# Patient Record
Sex: Female | Born: 1990
Health system: Southern US, Community
[De-identification: ages and names within clinical notes are randomized; demographics above are authoritative.]

## PROBLEM LIST (undated history)

## (undated) DIAGNOSIS — J45909 Unspecified asthma, uncomplicated: Secondary | ICD-10-CM

## (undated) DIAGNOSIS — N809 Endometriosis, unspecified: Secondary | ICD-10-CM

## (undated) HISTORY — PX: TONSILLECTOMY: SUR1361

---
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
## 2011-03-25 ENCOUNTER — Other Ambulatory Visit: Payer: Self-pay | Admitting: Obstetrics and Gynecology

## 2011-03-25 DIAGNOSIS — N63 Unspecified lump in unspecified breast: Secondary | ICD-10-CM

## 2011-03-30 ENCOUNTER — Other Ambulatory Visit: Payer: Self-pay

## 2011-04-02 ENCOUNTER — Ambulatory Visit
Admission: RE | Admit: 2011-04-02 | Discharge: 2011-04-02 | Disposition: A | Discharge status: Home / Self Care | Payer: Self-pay | Source: Ambulatory Visit | Attending: Obstetrics and Gynecology | Admitting: Obstetrics and Gynecology

## 2011-04-02 DIAGNOSIS — N63 Unspecified lump in unspecified breast: Secondary | ICD-10-CM

## 2011-04-14 HISTORY — PX: ABLATION ON ENDOMETRIOSIS: SHX5787

## 2011-10-12 ENCOUNTER — Other Ambulatory Visit: Payer: Self-pay | Admitting: Obstetrics and Gynecology

## 2011-10-12 DIAGNOSIS — N63 Unspecified lump in unspecified breast: Secondary | ICD-10-CM

## 2011-10-27 ENCOUNTER — Ambulatory Visit
Admission: RE | Admit: 2011-10-27 | Discharge: 2011-10-27 | Disposition: A | Discharge status: Home / Self Care | Payer: Federal, State, Local not specified - PPO | Source: Ambulatory Visit | Attending: Obstetrics and Gynecology | Admitting: Obstetrics and Gynecology

## 2011-10-27 DIAGNOSIS — N63 Unspecified lump in unspecified breast: Secondary | ICD-10-CM

## 2012-05-30 ENCOUNTER — Other Ambulatory Visit: Payer: Self-pay | Admitting: Obstetrics and Gynecology

## 2012-05-30 DIAGNOSIS — N63 Unspecified lump in unspecified breast: Secondary | ICD-10-CM

## 2012-06-17 ENCOUNTER — Other Ambulatory Visit: Payer: Federal, State, Local not specified - PPO

## 2012-07-04 ENCOUNTER — Ambulatory Visit
Admission: RE | Admit: 2012-07-04 | Discharge: 2012-07-04 | Disposition: A | Discharge status: Home / Self Care | Payer: Federal, State, Local not specified - PPO | Source: Ambulatory Visit | Attending: Obstetrics and Gynecology | Admitting: Obstetrics and Gynecology

## 2012-07-04 DIAGNOSIS — N63 Unspecified lump in unspecified breast: Secondary | ICD-10-CM

## 2013-04-19 ENCOUNTER — Other Ambulatory Visit: Payer: Self-pay | Admitting: Obstetrics and Gynecology

## 2013-04-19 DIAGNOSIS — N63 Unspecified lump in unspecified breast: Secondary | ICD-10-CM

## 2013-04-25 ENCOUNTER — Ambulatory Visit
Admission: RE | Admit: 2013-04-25 | Discharge: 2013-04-25 | Disposition: A | Discharge status: Home / Self Care | Payer: Federal, State, Local not specified - PPO | Source: Ambulatory Visit | Attending: Obstetrics and Gynecology | Admitting: Obstetrics and Gynecology

## 2013-04-25 ENCOUNTER — Other Ambulatory Visit: Payer: Federal, State, Local not specified - PPO

## 2013-04-25 DIAGNOSIS — N63 Unspecified lump in unspecified breast: Secondary | ICD-10-CM

## 2015-08-10 ENCOUNTER — Emergency Department (HOSPITAL_COMMUNITY): Payer: Federal, State, Local not specified - PPO

## 2015-08-10 ENCOUNTER — Encounter (HOSPITAL_COMMUNITY): Payer: Self-pay | Admitting: Emergency Medicine

## 2015-08-10 ENCOUNTER — Emergency Department (HOSPITAL_COMMUNITY)
Admission: EM | Admit: 2015-08-10 | Discharge: 2015-08-10 | Disposition: A | Discharge status: Home / Self Care | Payer: Federal, State, Local not specified - PPO | Attending: Emergency Medicine | Admitting: Emergency Medicine

## 2015-08-10 DIAGNOSIS — M25512 Pain in left shoulder: Secondary | ICD-10-CM | POA: Diagnosis not present

## 2015-08-10 DIAGNOSIS — R42 Dizziness and giddiness: Secondary | ICD-10-CM | POA: Insufficient documentation

## 2015-08-10 DIAGNOSIS — R200 Anesthesia of skin: Secondary | ICD-10-CM | POA: Diagnosis not present

## 2015-08-10 DIAGNOSIS — J45909 Unspecified asthma, uncomplicated: Secondary | ICD-10-CM | POA: Diagnosis not present

## 2015-08-10 DIAGNOSIS — D72829 Elevated white blood cell count, unspecified: Secondary | ICD-10-CM | POA: Diagnosis not present

## 2015-08-10 DIAGNOSIS — Z79899 Other long term (current) drug therapy: Secondary | ICD-10-CM | POA: Diagnosis not present

## 2015-08-10 DIAGNOSIS — M25511 Pain in right shoulder: Secondary | ICD-10-CM | POA: Diagnosis not present

## 2015-08-10 DIAGNOSIS — M791 Myalgia, unspecified site: Secondary | ICD-10-CM

## 2015-08-10 DIAGNOSIS — Z3202 Encounter for pregnancy test, result negative: Secondary | ICD-10-CM | POA: Insufficient documentation

## 2015-08-10 DIAGNOSIS — M542 Cervicalgia: Secondary | ICD-10-CM | POA: Diagnosis not present

## 2015-08-10 HISTORY — DX: Unspecified asthma, uncomplicated: J45.909

## 2015-08-10 LAB — CBC WITH DIFFERENTIAL/PLATELET
Basophils Absolute: 0 10*3/uL (ref 0.0–0.1)
Basophils Relative: 0 %
EOS PCT: 1 %
Eosinophils Absolute: 0.2 10*3/uL (ref 0.0–0.7)
HEMATOCRIT: 43.7 % (ref 36.0–46.0)
Hemoglobin: 14.3 g/dL (ref 12.0–15.0)
LYMPHS ABS: 2.5 10*3/uL (ref 0.7–4.0)
LYMPHS PCT: 16 %
MCH: 30.6 pg (ref 26.0–34.0)
MCHC: 32.7 g/dL (ref 30.0–36.0)
MCV: 93.4 fL (ref 78.0–100.0)
MONO ABS: 1.3 10*3/uL — AB (ref 0.1–1.0)
MONOS PCT: 8 %
NEUTROS ABS: 11.5 10*3/uL — AB (ref 1.7–7.7)
Neutrophils Relative %: 75 %
Platelets: 295 10*3/uL (ref 150–400)
RBC: 4.68 MIL/uL (ref 3.87–5.11)
RDW: 12.7 % (ref 11.5–15.5)
WBC: 15.5 10*3/uL — ABNORMAL HIGH (ref 4.0–10.5)

## 2015-08-10 LAB — I-STAT BETA HCG BLOOD, ED (MC, WL, AP ONLY): I-stat hCG, quantitative: <5 m[IU]/mL (ref <5)

## 2015-08-10 LAB — BASIC METABOLIC PANEL
ANION GAP: 11 (ref 5–15)
BUN: 8 mg/dL (ref 6–20)
CALCIUM: 9.2 mg/dL (ref 8.9–10.3)
CO2: 23 mmol/L (ref 22–32)
CREATININE: 0.81 mg/dL (ref 0.44–1.00)
Chloride: 102 mmol/L (ref 101–111)
GFR calc Af Amer: >60 mL/min (ref >60)
GFR calc non Af Amer: >60 mL/min (ref >60)
GLUCOSE: 101 mg/dL — AB (ref 65–99)
Potassium: 3.9 mmol/L (ref 3.5–5.1)
Sodium: 136 mmol/L (ref 135–145)

## 2015-08-10 LAB — CK: CK TOTAL: 52 U/L (ref 38–234)

## 2015-08-10 LAB — D-DIMER, QUANTITATIVE: D-Dimer, Quant: <0.27 ug/mL-FEU (ref 0.00–0.50)

## 2015-08-10 LAB — TROPONIN I: Troponin I: <0.03 ng/mL (ref <0.031)

## 2015-08-10 MED ORDER — ACETAMINOPHEN 325 MG PO TABS
650.0000 mg | ORAL_TABLET | Freq: Once | ORAL | Status: AC
  Administered 2015-08-10: 650 mg via ORAL
  Filled 2015-08-10: qty 2

## 2015-08-10 MED ORDER — KETOROLAC TROMETHAMINE 60 MG/2ML IM SOLN
60.0000 mg | Freq: Once | INTRAMUSCULAR | Status: AC
  Administered 2015-08-10: 60 mg via INTRAMUSCULAR
  Filled 2015-08-10: qty 2

## 2015-08-10 MED ORDER — CYCLOBENZAPRINE HCL 10 MG PO TABS: 10.0000 mg | ORAL_TABLET | Freq: Two times a day (BID) | ORAL | Status: AC | PRN

## 2015-08-10 MED ORDER — NAPROXEN 500 MG PO TABS
500.0000 mg | ORAL_TABLET | Freq: Two times a day (BID) | ORAL | Status: AC
Start: 2015-08-10 — End: ?

## 2015-08-10 NOTE — ED Notes (Signed)
Pt transported to xray 

## 2015-08-10 NOTE — ED Provider Notes (Signed)
CSN: [id number]     Arrival date & time 08/10/15  [id number] History   First MD Initiated Contact with Patient 08/10/15 [phone]     Chief Complaint  Patient presents with  . Arm Pain     (Consider location/radiation/quality/duration/timing/severity/associated sxs/prior Treatment) HPI Nicole Cowan is a 25 y.o. female with PMH significant for asthma who presents with gradual onset, constant, worsening neck, bilateral shoulders, and right arm soreness that began approximately 11 PM last night.  She reports she has been sick for the past month with URI and a sinus infection, so she reports persistent cough with soreness.  No modifying factors.  She reports trying muscle relaxers and OTC pain medications without relief.  She does state her bilateral hands felt numb on the way to the ED this morning.  She denies HA, neck stiffness, weakness.  Denies trauma. No hx of DVT/PE, unilateral leg swelling, recent surgery/trauma.  She does take PO OCP.  Past Medical History  Diagnosis Date  . Asthma    Past Surgical History  Procedure Laterality Date  . Tonsillectomy     No family history on file. Social History  Substance Use Topics  . Smoking status: Never Smoker   . Smokeless tobacco: None  . Alcohol Use: Yes     Comment: "occassionally"   OB History    No data available     Review of Systems  Constitutional: Negative for fever and chills.  Respiratory: Positive for cough. Negative for shortness of breath.   Cardiovascular: Negative for chest pain and leg swelling.  Gastrointestinal: Negative for nausea, vomiting, abdominal pain and diarrhea.  Genitourinary: Negative for dysuria, urgency and hematuria.  Neurological: Positive for dizziness and numbness. Negative for weakness and headaches.  All other systems reviewed and are negative.     Allergies  Review of patient's allergies indicates no known allergies.  Home Medications   Prior to Admission medications   Medication Sig Start  Date End Date Taking? Authorizing Provider  albuterol (PROVENTIL HFA;VENTOLIN HFA) 108 (90 Base) MCG/ACT inhaler Inhale 1-2 puffs into the lungs every 6 (six) hours as needed for wheezing or shortness of breath.   Yes Historical Provider, MD  cetirizine (ZYRTEC) 10 MG tablet Take 10 mg by mouth daily.   Yes Historical Provider, MD  Norethindrone Acetate-Ethinyl Estrad-FE (MICROGESTIN 24 FE) 1-20 MG-MCG(24) tablet Take 1 tablet by mouth daily.   Yes Historical Provider, MD   BP 138/84 mmHg  Pulse 92  Temp(Src) 98.1 F (36.7 C) (Rectal)  Resp 16  SpO2 98%  LMP 07/18/2015 Physical Exam  Constitutional: She is oriented to person, place, and time. She appears well-developed and well-nourished.  Non-toxic appearance. She does not have a sickly appearance. She does not appear ill.  HENT:  Head: Normocephalic and atraumatic.  Mouth/Throat: Oropharynx is clear and moist.  Eyes: Conjunctivae are normal. Pupils are equal, round, and reactive to light.  Neck: Normal range of motion. Neck supple.  No nuchal rigidity.  FAROM of neck in forward flexion and lateral flexion without pain. No midline tenderness.  No paracervical musculature tenderness.   Cardiovascular: Normal rate, regular rhythm and normal heart sounds.   No murmur heard. Pulses:      Radial pulses are 2+ on the right side, and 2+ on the left side.  Pulmonary/Chest: Effort normal and breath sounds normal. No accessory muscle usage or stridor. No respiratory distress. She has no wheezes. She has no rhonchi. She has no rales.  Abdominal: Soft. Bowel sounds  are normal. She exhibits no distension. There is no tenderness. There is no rebound and no guarding.  Musculoskeletal: Normal range of motion. She exhibits no tenderness.  No thoracic, lumbar, or sacral midline tenderness.   Lymphadenopathy:    She has no cervical adenopathy.  Neurological: She is alert and oriented to person, place, and time. She has normal strength. No cranial nerve  deficit or sensory deficit. Coordination normal.  Speech clear without dysarthria.  Skin: Skin is warm and dry.  Psychiatric: She has a normal mood and affect. Her behavior is normal.    ED Course  Procedures (including critical care time) Labs Review Labs Reviewed  CBC WITH DIFFERENTIAL/PLATELET - Abnormal; Notable for the following:    WBC 15.5 (*)    Neutro Abs 11.5 (*)    Monocytes Absolute 1.3 (*)    All other components within normal limits  BASIC METABOLIC PANEL - Abnormal; Notable for the following:    Glucose, Bld 101 (*)    All other components within normal limits  TROPONIN I  D-DIMER, QUANTITATIVE (NOT AT [facility])  CK  I-STAT BETA HCG BLOOD, ED ([facility], [facility], [facility] ONLY)    Imaging Review Dg Neck Soft Tissue  08/10/2015  CLINICAL DATA:  Sore throat cough and fever recently now short of breath EXAM: NECK SOFT TISSUES - 1+ VIEW COMPARISON:  None. FINDINGS: There is no evidence of retropharyngeal soft tissue swelling or epiglottic enlargement. The cervical airway is unremarkable and no radio-opaque foreign body identified. IMPRESSION: Negative. Electronically Signed   By: Skipper Cliche M.D.   On: 08/10/2015 08:26   Dg Chest 2 View  08/10/2015  CLINICAL DATA:  Sore throat cough and fever for 1 month EXAM: CHEST  2 VIEW COMPARISON:  None. FINDINGS: Normal heart size. Lungs clear. No pneumothorax. No pleural effusion. Mild bronchitic changes and hyperaeration. IMPRESSION: Mild bronchitic changes and hyperaeration. Electronically Signed   By: Marybelle Killings M.D.   On: 08/10/2015 08:24   I have personally reviewed and evaluated these images and lab results as part of my medical decision-making.   EKG Interpretation   Date/Time:  Saturday August 10 2015 08:41:33 EDT Ventricular Rate:  85 PR Interval:  142 QRS Duration: 86 QT Interval:  358 QTC Calculation: 426 R Axis:   73 Text Interpretation:  Sinus rhythm No previous ECGs available Confirmed by  RANCOUR  MD, STEPHEN [phone]) on  08/10/2015 8:45:38 AM      MDM   Final diagnoses:  Neck pain  Bilateral shoulder pain  Muscle soreness  Leukocytosis   Patient presents with neck pain, bilateral shoulder pain, and right arm pain. She describes it as a soreness. No injury or trauma. Patient does state she has been sick for the past couple of weeks. She was taking a cough suppressant as well as by mouth steroids for a recent URI where she was having cough with sharp chest pain and shortness of breath. On exam, patient appears well, nontoxic or septic appearing. No nuchal rigidity. Full active range of motion of cervical spine in forward and lateral flexion without pain. No cervical midline tenderness. No paracervical muscular tenderness. Heart RRR, lungs CTAP, abdomen soft and benign. Normal neuro exam.  Doubt meningitis. Doubt ACS, HEART score 0.  EKG SR without acute changes, troponin 0.00. CXR negative. Low risk PE using Well's criteria, unable to [facility] due to exogenous estrogen use, d-dimer negative.  Doubt rhabdomyolysis, CK 52.  No metabolic dearrangements. Leukocytosis, WBC 15.5, likely due to steroid use.  Doubt mononucleosis, no sore throat.  No trauma/injury, negative cervical spine plain films.  Patient received Toradol and Tylenol in ED with symptom resolution.  Repeat exam, continued FAROM of cervical spine without pain.  No rigidity.  This is likely musculoskeletal.  Plan to discharge home with Naproxen and Flexeril.  Follow up PCP in 2-3 days.  Discussed return precautions.  Patient agrees and acknowledges the above plan for discharge.   Case has been discussed with and seen by Dr. Wyvonnia Dusky who agrees with the above plan for discharge.     Gloriann Loan, PA-C 08/10/15 [address] PA-C 08/10/15 [place], MD 08/10/15 [phone]

## 2015-08-10 NOTE — ED Notes (Signed)
Pt from home with c/o neck pain, shoulder pain and right arm pain. Pt denies trauma.

## 2015-08-10 NOTE — Discharge Instructions (Signed)
Your xrays today show no acute changes.  Your lab work today is reassuring.  This is likely musculoskeletal in nature.  Please take Naproxen twice daily and Flexeril twice daily or at bedtime.  Do not operate heavy machinery or drive while taking Flexeril.  Follow up with your primary physician in the next 2-3 days.  Return if you experiencing worsening pain, neck stiffness, numbness, or weakness.  Muscle Pain, Adult Muscle pain (myalgia) may be caused by many things, including:  Overuse or muscle strain, especially if you are not in shape. This is the most common cause of muscle pain.  Injury.  Bruises.  Viruses, such as the flu.  Infectious diseases.  Fibromyalgia, which is a chronic condition that causes muscle tenderness, fatigue, and headache.  Autoimmune diseases, including lupus.  Certain drugs, including ACE inhibitors and statins. Muscle pain may be mild or severe. In most cases, the pain lasts only a short time and goes away without treatment. To diagnose the cause of your muscle pain, your health care provider will take your medical history. This means he or she will ask you when your muscle pain began and what has been happening. If you have not had muscle pain for very long, your health care provider may want to wait before doing much testing. If your muscle pain has lasted a long time, your health care provider may want to run tests right away. If your health care provider thinks your muscle pain may be caused by illness, you may need to have additional tests to rule out certain conditions.  Treatment for muscle pain depends on the cause. Home care is often enough to relieve muscle pain. Your health care provider may also prescribe anti-inflammatory medicine. HOME CARE INSTRUCTIONS Watch your condition for any changes. The following actions may help to lessen any discomfort you are feeling:  Only take over-the-counter or prescription medicines as directed by your health care  provider.  Apply ice to the sore muscle:  Put ice in a plastic bag.  Place a towel between your skin and the bag.  Leave the ice on for 15-20 minutes, 3-4 times a day.  You may alternate applying hot and cold packs to the muscle as directed by your health care provider.  If overuse is causing your muscle pain, slow down your activities until the pain goes away.  Remember that it is normal to feel some muscle pain after starting a workout program. Muscles that have not been used often will be sore at first.  Do regular, gentle exercises if you are not usually active.  Warm up before exercising to lower your risk of muscle pain.  Do not continue working out if the pain is very bad. Bad pain could mean you have injured a muscle. SEEK MEDICAL CARE IF:  Your muscle pain gets worse, and medicines do not help.  You have muscle pain that lasts longer than 3 days.  You have a rash or fever along with muscle pain.  You have muscle pain after a tick bite.  You have muscle pain while working out, even though you are in good physical condition.  You have redness, soreness, or swelling along with muscle pain.  You have muscle pain after starting a new medicine or changing the dose of a medicine. SEEK IMMEDIATE MEDICAL CARE IF:  You have trouble breathing.  You have trouble swallowing.  You have muscle pain along with a stiff neck, fever, and vomiting.  You have severe muscle weakness  or cannot move part of your body. MAKE SURE YOU:   Understand these instructions.  Will watch your condition.  Will get help right away if you are not doing well or get worse.   This information is not intended to replace advice given to you by your health care provider. Make sure you discuss any questions you have with your health care provider.   Document Released: 02/19/2006 Document Revised: 04/20/2014 Document Reviewed: 01/24/2013 Elsevier Interactive Patient Education [employer].

## 2016-10-13 ENCOUNTER — Other Ambulatory Visit: Payer: Self-pay | Admitting: Physician Assistant

## 2016-10-13 ENCOUNTER — Ambulatory Visit
Admission: RE | Admit: 2016-10-13 | Discharge: 2016-10-13 | Disposition: A | Discharge status: Home / Self Care | Payer: Federal, State, Local not specified - PPO | Source: Ambulatory Visit | Attending: Physician Assistant | Admitting: Physician Assistant

## 2016-10-13 DIAGNOSIS — J189 Pneumonia, unspecified organism: Secondary | ICD-10-CM

## 2018-12-30 ENCOUNTER — Other Ambulatory Visit: Payer: Self-pay | Admitting: Obstetrics and Gynecology

## 2018-12-30 DIAGNOSIS — D242 Benign neoplasm of left breast: Secondary | ICD-10-CM

## 2018-12-30 DIAGNOSIS — D241 Benign neoplasm of right breast: Secondary | ICD-10-CM

## 2019-01-04 ENCOUNTER — Other Ambulatory Visit (HOSPITAL_COMMUNITY): Payer: Self-pay | Admitting: *Deleted

## 2019-01-04 DIAGNOSIS — N63 Unspecified lump in unspecified breast: Secondary | ICD-10-CM

## 2019-01-24 ENCOUNTER — Other Ambulatory Visit: Payer: Self-pay

## 2019-01-24 ENCOUNTER — Ambulatory Visit
Admission: RE | Admit: 2019-01-24 | Discharge: 2019-01-24 | Disposition: A | Discharge status: Home / Self Care | Payer: Federal, State, Local not specified - PPO | Source: Ambulatory Visit | Attending: Obstetrics and Gynecology | Admitting: Obstetrics and Gynecology

## 2019-01-24 ENCOUNTER — Ambulatory Visit (HOSPITAL_COMMUNITY)
Admission: RE | Admit: 2019-01-24 | Discharge: 2019-01-24 | Disposition: A | Discharge status: Home / Self Care | Payer: Self-pay | Source: Ambulatory Visit | Attending: Obstetrics and Gynecology | Admitting: Obstetrics and Gynecology

## 2019-01-24 ENCOUNTER — Encounter (HOSPITAL_COMMUNITY): Payer: Self-pay

## 2019-01-24 ENCOUNTER — Ambulatory Visit
Admission: RE | Admit: 2019-01-24 | Discharge: 2019-01-24 | Disposition: A | Discharge status: Home / Self Care | Payer: No Typology Code available for payment source | Source: Ambulatory Visit | Attending: Obstetrics and Gynecology | Admitting: Obstetrics and Gynecology

## 2019-01-24 DIAGNOSIS — Z1239 Encounter for other screening for malignant neoplasm of breast: Secondary | ICD-10-CM | POA: Insufficient documentation

## 2019-01-24 DIAGNOSIS — N6310 Unspecified lump in the right breast, unspecified quadrant: Secondary | ICD-10-CM

## 2019-01-24 DIAGNOSIS — N6321 Unspecified lump in the left breast, upper outer quadrant: Secondary | ICD-10-CM | POA: Insufficient documentation

## 2019-01-24 DIAGNOSIS — N63 Unspecified lump in unspecified breast: Secondary | ICD-10-CM

## 2019-01-24 HISTORY — DX: Endometriosis, unspecified: N80.9

## 2019-01-24 NOTE — Patient Instructions (Signed)
Explained breast self awareness with Randell Loop. Patient did not need a Pap smear today due to last Pap smear was in 04/25/2018 per patient. Let her know [facility] will cover Pap smears every 3 years unless has a history of abnormal Pap smears. Referred patient to the [facility] for a bilateral breast ultrasound Appointment scheduled for Tuesday, January 24, 2019 at 1530. Patient aware of appointment and will be there. Randell Loop verbalized understanding.  [place], Arvil Chaco, RN 1:35 PM

## 2019-01-24 NOTE — Progress Notes (Signed)
Complaints of bilateral breast lumps since she was 28 years old that patient stated she has noticed an increase in size in the left breast lumps. Patient states the lumps are tender to the touch and the pain started 6 months ago. Patient rates the pain at a 4-5 out of 10.  Pap Smear: Pap smear not completed today. Last Pap smear was 01/24/2019 at Dr. Gregor Hams office and patient is awaiting results. Per patient has a history of an abnormal Pap smear in 2017 that a repeat Pap smear was completed for follow-up that was normal. No Pap smear results are in Epic.  Physical exam: Breasts Breasts symmetrical. No skin abnormalities left breast. Scar observed right upper inner breast that per patient was from a history of a lumpectomy for a benign breast lump. No nipple retraction bilateral breasts. No nipple discharge bilateral breasts. No lymphadenopathy. Palpated a lump within the right breast under the scar at 2 o'clock 5 cm from the nipple. Palpated two lumps within the left breast at 2 o'clock under the areola that was mobile and at 2 o'clock 5 cm from the nipple. Complaints of tenderness when palpated bilateral breast lumps. Referred patient to the [facility] for a bilateral breast ultrasound Appointment scheduled for Tuesday, January 24, 2019 at 1530.        Pelvic/Bimanual No Pap smear completed today since last Pap smear was 01/24/2019. Pap smear not indicated per [facility] guidelines.   Smoking History: Patient has never smoked.  Patient Navigation: Patient education provided. Access to services provided for patient through [facility] program.   Breast and Cervical Cancer Risk Assessment: Patient has a family history of a maternal great aunt having breast cancer. Patient has no known genetic mutations or history of radiation treatment to the chest before age 33. Patient has no history of cervical dysplasia, immunocompromised, or DES exposure in-utero. Breast cancer risk assessment completed.  No breast cancer risk calculated due to patient is less than 75 years old.

## 2019-02-06 ENCOUNTER — Other Ambulatory Visit (HOSPITAL_COMMUNITY): Payer: Self-pay | Admitting: Obstetrics and Gynecology

## 2019-02-06 ENCOUNTER — Other Ambulatory Visit: Payer: Self-pay

## 2019-02-06 ENCOUNTER — Ambulatory Visit
Admission: RE | Admit: 2019-02-06 | Discharge: 2019-02-06 | Disposition: A | Discharge status: Home / Self Care | Payer: No Typology Code available for payment source | Source: Ambulatory Visit | Attending: Obstetrics and Gynecology | Admitting: Obstetrics and Gynecology

## 2019-02-06 DIAGNOSIS — N63 Unspecified lump in unspecified breast: Secondary | ICD-10-CM

## 2019-02-06 DIAGNOSIS — N6320 Unspecified lump in the left breast, unspecified quadrant: Secondary | ICD-10-CM

## 2019-04-05 DIAGNOSIS — F411 Generalized anxiety disorder: Secondary | ICD-10-CM | POA: Diagnosis not present

## 2019-04-14 HISTORY — PX: BREAST EXCISIONAL BIOPSY: SUR124

## 2019-05-01 DIAGNOSIS — F411 Generalized anxiety disorder: Secondary | ICD-10-CM | POA: Diagnosis not present

## 2019-05-12 DIAGNOSIS — F411 Generalized anxiety disorder: Secondary | ICD-10-CM | POA: Diagnosis not present

## 2019-05-25 DIAGNOSIS — F411 Generalized anxiety disorder: Secondary | ICD-10-CM | POA: Diagnosis not present

## 2019-06-06 DIAGNOSIS — F411 Generalized anxiety disorder: Secondary | ICD-10-CM | POA: Diagnosis not present

## 2019-08-08 ENCOUNTER — Ambulatory Visit
Admission: RE | Admit: 2019-08-08 | Discharge: 2019-08-08 | Disposition: A | Discharge status: Home / Self Care | Payer: No Typology Code available for payment source | Source: Ambulatory Visit | Attending: Obstetrics and Gynecology | Admitting: Obstetrics and Gynecology

## 2019-08-08 ENCOUNTER — Other Ambulatory Visit: Payer: Self-pay

## 2019-08-08 ENCOUNTER — Other Ambulatory Visit: Payer: Self-pay | Admitting: Obstetrics and Gynecology

## 2019-08-08 DIAGNOSIS — D242 Benign neoplasm of left breast: Secondary | ICD-10-CM

## 2019-08-08 DIAGNOSIS — N6320 Unspecified lump in the left breast, unspecified quadrant: Secondary | ICD-10-CM

## 2019-09-18 DIAGNOSIS — Z03818 Encounter for observation for suspected exposure to other biological agents ruled out: Secondary | ICD-10-CM | POA: Diagnosis not present

## 2019-09-18 DIAGNOSIS — J45909 Unspecified asthma, uncomplicated: Secondary | ICD-10-CM | POA: Diagnosis not present

## 2019-09-18 DIAGNOSIS — J22 Unspecified acute lower respiratory infection: Secondary | ICD-10-CM | POA: Diagnosis not present

## 2020-01-25 DIAGNOSIS — D179 Benign lipomatous neoplasm, unspecified: Secondary | ICD-10-CM | POA: Diagnosis not present

## 2020-01-25 DIAGNOSIS — Z131 Encounter for screening for diabetes mellitus: Secondary | ICD-10-CM | POA: Diagnosis not present

## 2020-01-25 DIAGNOSIS — Z13220 Encounter for screening for lipoid disorders: Secondary | ICD-10-CM | POA: Diagnosis not present

## 2020-02-08 ENCOUNTER — Ambulatory Visit
Admission: RE | Admit: 2020-02-08 | Discharge: 2020-02-08 | Disposition: A | Discharge status: Home / Self Care | Payer: BC Managed Care – PPO | Source: Ambulatory Visit | Attending: Obstetrics and Gynecology | Admitting: Obstetrics and Gynecology

## 2020-02-08 ENCOUNTER — Other Ambulatory Visit: Payer: Self-pay

## 2020-02-08 DIAGNOSIS — D242 Benign neoplasm of left breast: Secondary | ICD-10-CM

## 2020-02-08 DIAGNOSIS — N6322 Unspecified lump in the left breast, upper inner quadrant: Secondary | ICD-10-CM | POA: Diagnosis not present

## 2020-02-08 DIAGNOSIS — Z6828 Body mass index (BMI) 28.0-28.9, adult: Secondary | ICD-10-CM | POA: Diagnosis not present

## 2020-02-08 DIAGNOSIS — Z01419 Encounter for gynecological examination (general) (routine) without abnormal findings: Secondary | ICD-10-CM | POA: Diagnosis not present

## 2020-02-08 DIAGNOSIS — N6321 Unspecified lump in the left breast, upper outer quadrant: Secondary | ICD-10-CM | POA: Diagnosis not present

## 2020-02-08 DIAGNOSIS — N6323 Unspecified lump in the left breast, lower outer quadrant: Secondary | ICD-10-CM | POA: Diagnosis not present

## 2020-02-14 ENCOUNTER — Ambulatory Visit: Payer: Self-pay | Admitting: Surgery

## 2020-02-14 DIAGNOSIS — D171 Benign lipomatous neoplasm of skin and subcutaneous tissue of trunk: Secondary | ICD-10-CM | POA: Diagnosis not present

## 2020-02-14 NOTE — H&P (Signed)
History of Present Illness  The patient is a 29 year old female who presents with a complaint of Mass. [name] is a 29 year old female who presents to the surgery clinic due to a mass on her back. The mass has been there for some time and has been slowly growing. As it has grown it has become more noticeable and irritating under her clothes. Her primary care physician informed her this was likely a lipoma and referred her to our office for surgical excision. She has had previous resections of benign breast masses. She has asthma. She has dealt with endometriosis in the past.  Past Surgical History  Breast Biopsy  Right. Oral Surgery  Tonsillectomy   Diagnostic Studies History Colonoscopy  never Mammogram  never Pap Smear  1-5 years ago  Allergies  No Known Drug Allergies  [02/14/2020]: Allergies Reconciled   Medication History valACYclovir HCl (500MG  Tablet, Oral) Active. Ipratropium Bromide (0.06% Solution, Nasal) Active. Albuterol Sulfate HFA (108 (90 Base)MCG/ACT Aerosol Soln, Inhalation) Active. ZyrTEC Allergy (10MG  Capsule, Oral) Active. Multivitamin Adult (Oral) Active. Medications Reconciled  Social History Alcohol use  Occasional alcohol use. Caffeine use  Carbonated beverages, Coffee, Tea. No drug use  Tobacco use  Never smoker.  Family History Arthritis  Father, Mother. Depression  Mother. Migraine Headache  Mother.  Pregnancy / Birth History Age at menarche  65 years. Contraceptive History  Oral contraceptives. Gravida  0 Para  0 Regular periods   Other Problems Anxiety Disorder  Asthma  Back Pain  Bladder Problems  Gastroesophageal Reflux Disease  Lump In Breast  Migraine Headache     Review of Systems  General Present- Fatigue. Not Present- Appetite Loss, Chills, Fever, Night Sweats, Weight Gain and Weight Loss. Skin Not Present- Change in Wart/Mole, Dryness, Hives, Jaundice, New Lesions, Non-Healing Wounds,  Rash and Ulcer. HEENT Present- Seasonal Allergies and Wears glasses/contact lenses. Not Present- Earache, Hearing Loss, Hoarseness, Nose Bleed, Oral Ulcers, Ringing in the Ears, Sinus Pain, Sore Throat, Visual Disturbances and Yellow Eyes. Respiratory Not Present- Bloody sputum, Chronic Cough, Difficulty Breathing, Snoring and Wheezing. Breast Present- Breast Mass. Not Present- Breast Pain, Nipple Discharge and Skin Changes. Cardiovascular Not Present- Chest Pain, Difficulty Breathing Lying Down, Leg Cramps, Palpitations, Rapid Heart Rate, Shortness of Breath and Swelling of Extremities. Gastrointestinal Not Present- Abdominal Pain, Bloating, Bloody Stool, Change in Bowel Habits, Chronic diarrhea, Constipation, Difficulty Swallowing, Excessive gas, Gets full quickly at meals, Hemorrhoids, Indigestion, Nausea, Rectal Pain and Vomiting. Female Genitourinary Present- Urgency. Not Present- Frequency, Nocturia, Painful Urination and Pelvic Pain. Musculoskeletal Present- Back Pain and Muscle Pain. Not Present- Joint Pain, Joint Stiffness, Muscle Weakness and Swelling of Extremities. Neurological Present- Headaches and Tingling. Not Present- Decreased Memory, Fainting, Numbness, Seizures, Tremor, Trouble walking and Weakness. Psychiatric Present- Anxiety. Not Present- Bipolar, Change in Sleep Pattern, Depression, Fearful and Frequent crying. Endocrine Not Present- Cold Intolerance, Excessive Hunger, Hair Changes, Heat Intolerance, Hot flashes and New Diabetes. Hematology Present- Easy Bruising. Not Present- Blood Thinners, Excessive bleeding, Gland problems, HIV and Persistent Infections.  Vitals  Weight: 184 lb Height: 67in Body Surface Area: 1.95 m Body Mass Index: 28.82 kg/m  Temp.: 98.60F  Pulse: 113 (Regular)  P.OX: 98% (Room air) BP: 128/82(Sitting, Left Arm, Standard)  Physical Exam General Mental Status-Alert. General Appearance-Consistent with stated age. Posture-Normal  posture. Voice-Normal.  Integumentary Note: no rash or lesion on limited exam.  there is a 6 cm cm round, rubbery, mobile, subcutaneous mass on the right upper back  Head and Neck Head -Note: atraumatic, normocephalic.  Face Strength and Tone - facial muscle strength and tone is normal.  Eye Eyeball - Bilateral-Extraocular movements intact. Sclera/Conjunctiva - Bilateral-Normal.  Chest and Lung Exam Chest and lung exam reveals -quiet, even and easy respiratory effort with no use of accessory muscles.  Breast Breast - Left-Symmetric, Non Tender, No Biopsy scars, no Dimpling - Left, No Inflammation, No Lumpectomy scars, No Mastectomy scars, No Peau d' Orange. Breast - Right-Symmetric, Non Tender, No Biopsy scars, no Dimpling - Right, No Inflammation, No Lumpectomy scars, No Mastectomy scars, No Peau d' Orange. Breast Lump-No Palpable Breast Mass.  Cardiovascular Cardiovascular examination reveals -normal heart sounds, regular rate and rhythm with no murmurs and normal pedal pulses bilaterally.  Abdomen Inspection Inspection of the abdomen reveals - No Hernias. Skin - Scar - no surgical scars. Palpation/Percussion Palpation and Percussion of the abdomen reveal - Soft, Non Tender, No Rebound tenderness, No Rigidity (guarding) and No hepatosplenomegaly. Auscultation Auscultation of the abdomen reveals - Bowel sounds normal.  Neurologic Mental Status-Normal.  Neuropsychiatric Mental status exam performed with findings of-able to articulate well with normal speech/language, rate, volume and coherence. The patient's mood and affect are described as -normal. Judgment and Insight-insight is appropriate concerning matters relevant to self and the patient displays appropriate judgment regarding every day activities. Thought Processes/Cognitive Function-aware of current events.  Musculoskeletal Note: strength symmetrical throughout, no  deformity   Lymphatic Head & Neck  General Head & Neck Lymphatics: Bilateral - Description - Normal. Axillary  General Axillary Region: Bilateral - Description - Normal. Tenderness - Non Tender. Femoral & Inguinal  Generalized Femoral & Inguinal Lymphatics: Bilateral - Description - Normal. Tenderness - Non Tender.   Assessment & Plan LIPOMA OF BACK (D17.1) Impression: Discussed the benign nature of a lipoma and offered surgical resection as it is irritating and she would like it removed. The risks, benefits, and alternatives to surgery were discussed with the patient who granted consent to proceed. We will schedule for elective surgery as soon as time is available. Surgery scheduler will give you a call to schedule surgery.  Signed by Felicie Morn, MD

## 2020-03-11 DIAGNOSIS — R87610 Atypical squamous cells of undetermined significance on cytologic smear of cervix (ASC-US): Secondary | ICD-10-CM | POA: Diagnosis not present

## 2020-03-12 DIAGNOSIS — R87610 Atypical squamous cells of undetermined significance on cytologic smear of cervix (ASC-US): Secondary | ICD-10-CM | POA: Diagnosis not present

## 2020-04-24 DIAGNOSIS — B349 Viral infection, unspecified: Secondary | ICD-10-CM | POA: Diagnosis not present

## 2020-05-06 DIAGNOSIS — Z13220 Encounter for screening for lipoid disorders: Secondary | ICD-10-CM | POA: Diagnosis not present

## 2020-05-06 DIAGNOSIS — Z0000 Encounter for general adult medical examination without abnormal findings: Secondary | ICD-10-CM | POA: Diagnosis not present

## 2020-05-06 DIAGNOSIS — Z131 Encounter for screening for diabetes mellitus: Secondary | ICD-10-CM | POA: Diagnosis not present

## 2021-02-01 IMAGING — US US BREAST*R* LIMITED INC AXILLA
1 series · 8 of 8 positions shown · non-contrast
Comparison: Previous exam(s).

CLINICAL DATA: The patient has had multiple fibroadenomas removed
in the past. She had 10 masses followed in 5518 which were stable
over time and thought to be fibroadenomas. These masses are no
longer being followed. She felt a tender lump in the left breast at
10 o'clock, 6 cm from the nipple and was seen at an outside
institution on December 13, 2018 for an ultrasound. Bilateral masses
were seen and follow-up was requested. The patient is unsure whether
the tender mass in the left breast has changed in size over time.

EXAM:
ULTRASOUND OF THE BILATERAL BREAST

[Series 1: us breast*right* limited inc axilla · 0.07mm/px · 8 of 8 slices shown]
[im 1/8]
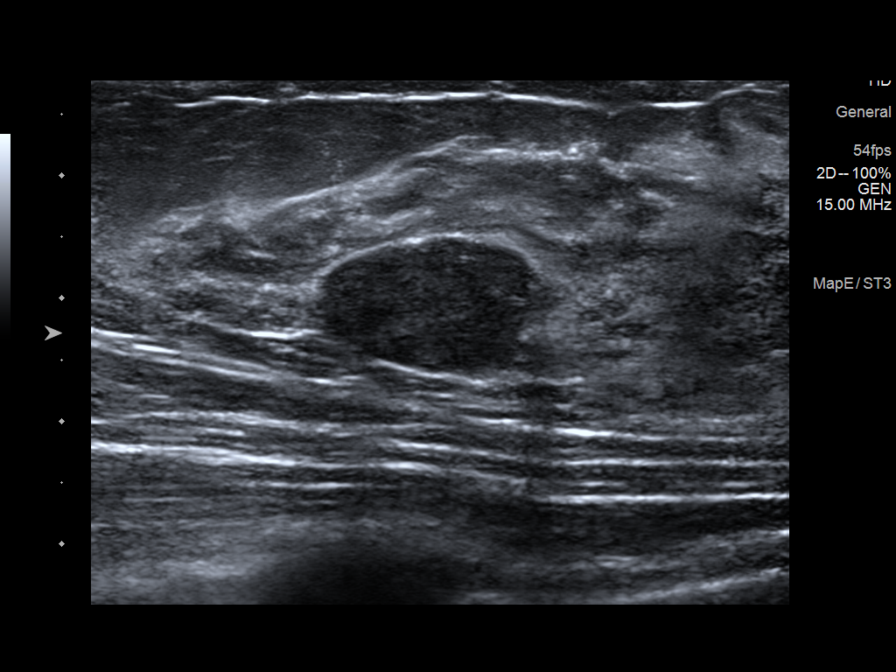
[im 2/8]
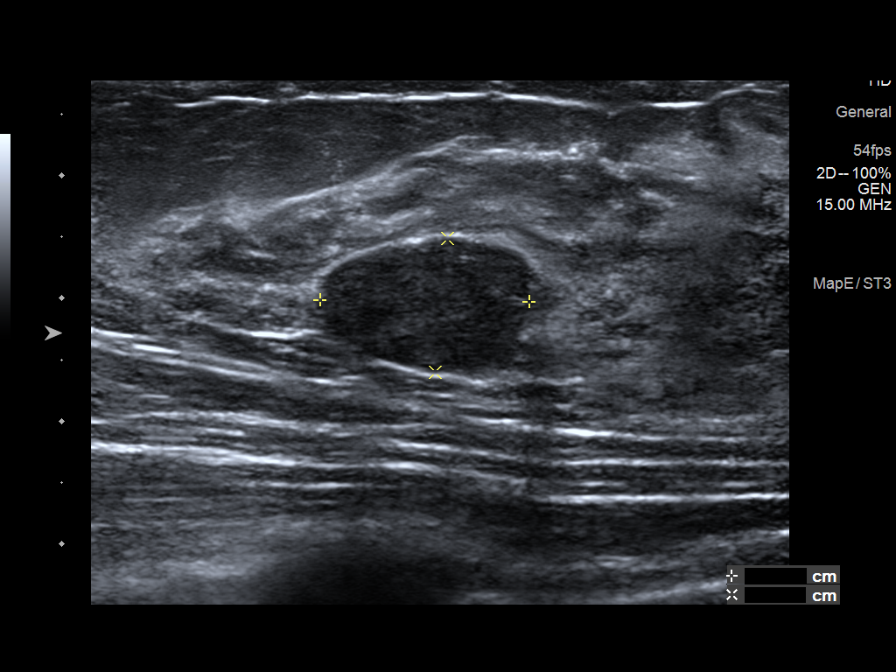
[im 3/8]
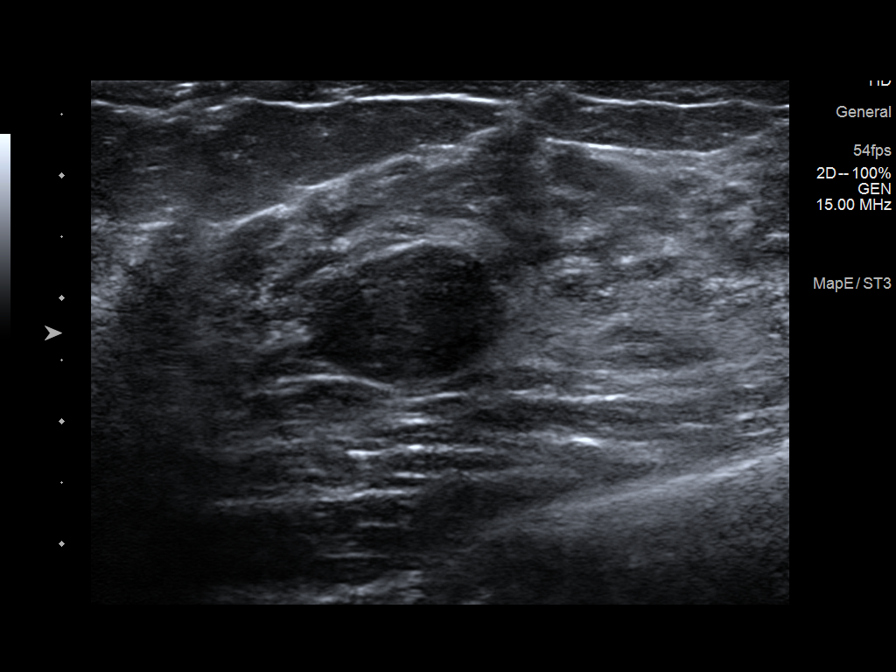
[im 4/8]
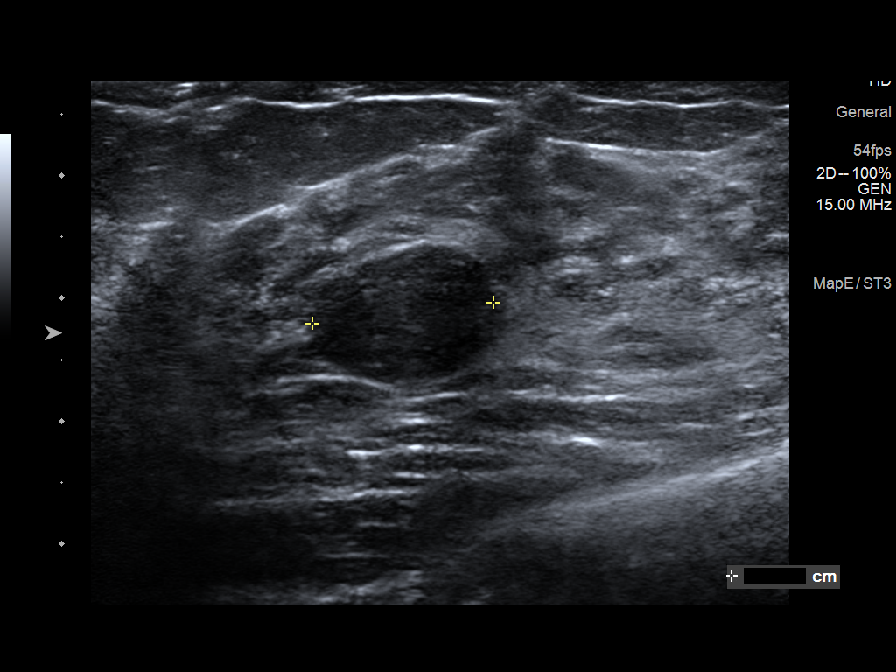
[im 5/8]
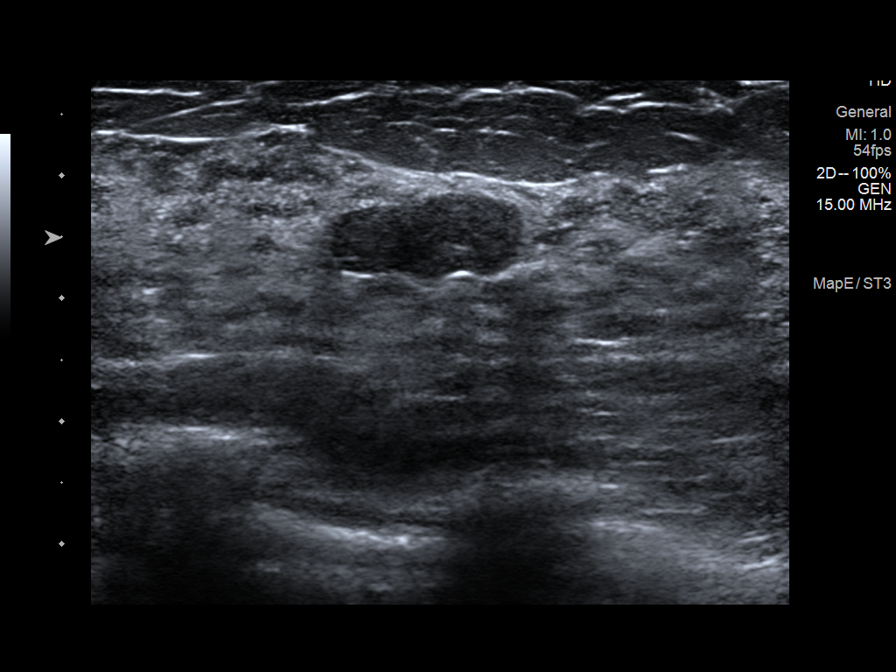
[im 6/8]
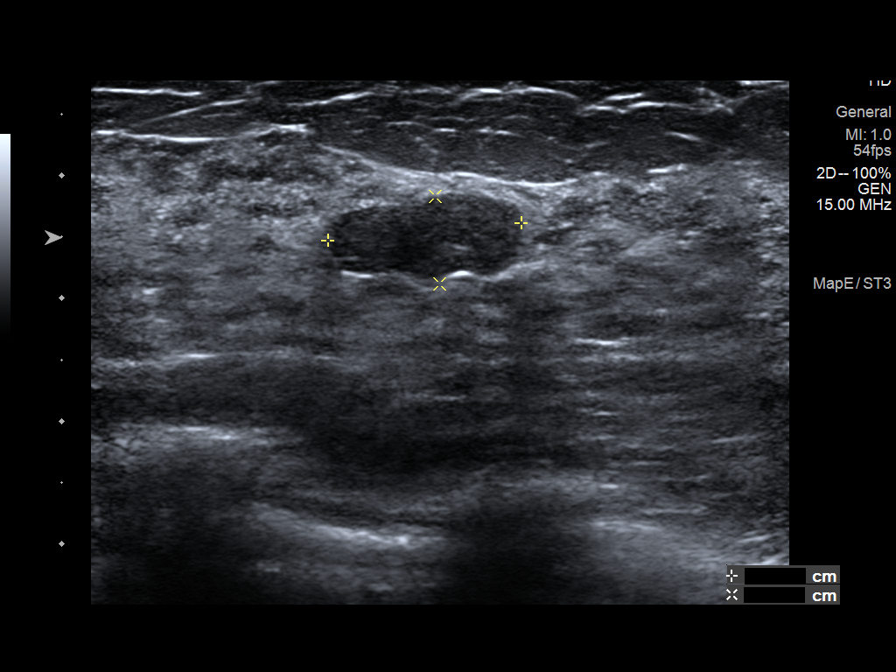
[im 7/8]
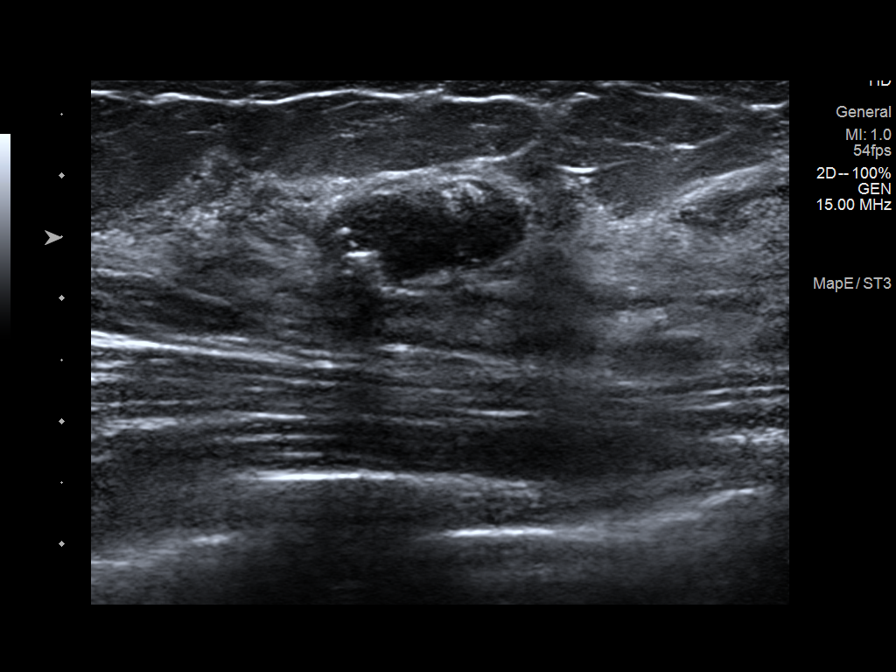
[im 8/8]
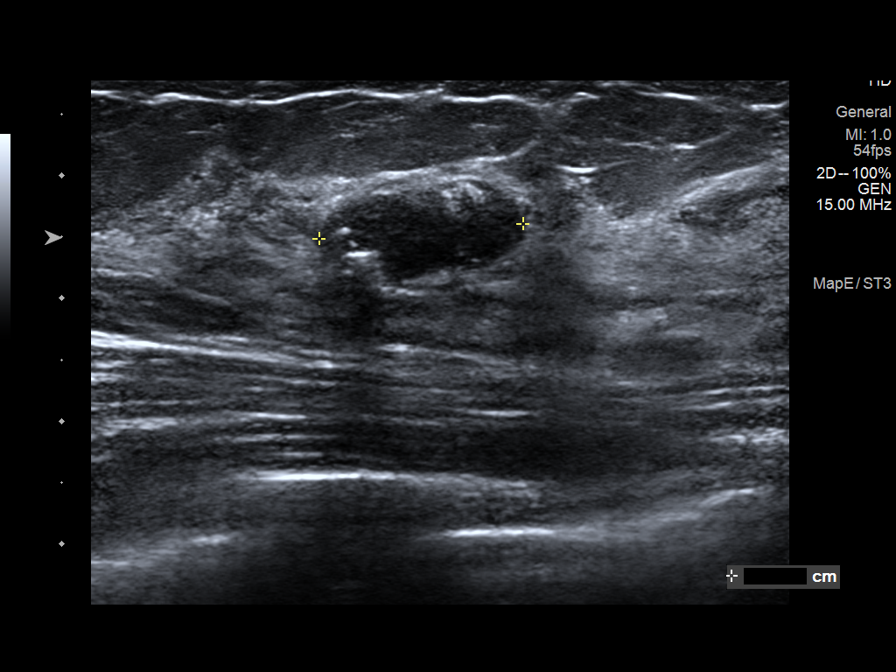

[8 of 8 positions shown; findings below may reference images not displayed]

FINDINGS: On physical exam, multiple bilateral lumps are identified.

Targeted ultrasound is performed, showing 2 masses in the right
breast. There is a mass at 12 o'clock, 4 cm from the nipple
measuring 17 x 11 x 15 mm smaller when compared to 5518. There is a
mass at 10 o'clock, 3 cm from the nipple measuring 16 x 7 x 17 mm
also smaller since 5518.

The mass seen on December 13, 2018 at 7 o'clock, 4 cm from the
nipple is smaller when compared to 5518.

There are 3 masses in the left breast. The first is seen at 2
o'clock, 2 cm from the nipple measuring 14 x 7 x 12 mm. There is a
mass at 3 o'clock, 1 cm from the nipple measuring 19 x 12 x 17 mm.
There is a final mass at 10 o'clock, 6 cm from the nipple which
correlates with the patient's tender palpable lump measuring 26 x 16
x 28 mm.
IMPRESSION: Two masses on the right are smaller since 5518. The mass at 7
o'clock on the left is smaller compared to 5518. Three additional
masses on the left have never been imaged previously. The mass at 10
o'clock, 6 cm from the nipple is tender but the patient is unsure
whether it has changed in size.

RECOMMENDATION:
The masses on the left at 2 o'clock, 2 cm from the nipple and 3
o'clock, 1 cm from the nipple will be followed in 6 months with
ultrasound. The mass at 10 o'clock, 6 cm from the nipple also be
followed in 6 months. We did discuss biopsying the mass at 10
o'clock in the left breast but it was decided to follow instead. The
patient will follow the 10 o'clock mass clinically and return
immediately if she feels it is growing.

I have discussed the findings and recommendations with the patient.
If applicable, a reminder letter will be sent to the patient
regarding the next appointment.

BI-RADS CATEGORY  3: Probably benign.

## 2021-04-01 DIAGNOSIS — Z6830 Body mass index (BMI) 30.0-30.9, adult: Secondary | ICD-10-CM | POA: Diagnosis not present

## 2021-04-01 DIAGNOSIS — Z01419 Encounter for gynecological examination (general) (routine) without abnormal findings: Secondary | ICD-10-CM | POA: Diagnosis not present

## 2021-04-04 DIAGNOSIS — Z1321 Encounter for screening for nutritional disorder: Secondary | ICD-10-CM | POA: Diagnosis not present

## 2021-04-04 DIAGNOSIS — Z13220 Encounter for screening for lipoid disorders: Secondary | ICD-10-CM | POA: Diagnosis not present

## 2021-04-04 DIAGNOSIS — Z1329 Encounter for screening for other suspected endocrine disorder: Secondary | ICD-10-CM | POA: Diagnosis not present

## 2021-04-04 DIAGNOSIS — Z131 Encounter for screening for diabetes mellitus: Secondary | ICD-10-CM | POA: Diagnosis not present

## 2021-04-04 DIAGNOSIS — Z13228 Encounter for screening for other metabolic disorders: Secondary | ICD-10-CM | POA: Diagnosis not present

## 2021-05-08 DIAGNOSIS — Z131 Encounter for screening for diabetes mellitus: Secondary | ICD-10-CM | POA: Diagnosis not present

## 2021-05-08 DIAGNOSIS — R5383 Other fatigue: Secondary | ICD-10-CM | POA: Diagnosis not present

## 2021-05-08 DIAGNOSIS — Z0000 Encounter for general adult medical examination without abnormal findings: Secondary | ICD-10-CM | POA: Diagnosis not present

## 2021-05-08 DIAGNOSIS — Z13220 Encounter for screening for lipoid disorders: Secondary | ICD-10-CM | POA: Diagnosis not present

## 2021-05-28 DIAGNOSIS — D171 Benign lipomatous neoplasm of skin and subcutaneous tissue of trunk: Secondary | ICD-10-CM | POA: Diagnosis not present

## 2021-07-15 DIAGNOSIS — E559 Vitamin D deficiency, unspecified: Secondary | ICD-10-CM | POA: Diagnosis not present

## 2021-08-03 IMAGING — US US BREAST*L* LIMITED INC AXILLA
1 series · 14 of 18 positions shown · non-contrast
Comparison: 02/06/2019.

CLINICAL DATA: Six-month interval follow-up of multiple likely
benign masses in the LEFT breast, likely fibroadenomas. She also has
multiple RIGHT breast masses which have been followed previously,
also likely benign fibroadenomas.

EXAM:
ULTRASOUND OF THE LEFT BREAST

[Series 1: us breast*left* limited inc axilla · 0.06mm/px · 14 of 18 slices shown]
[im 1/18]
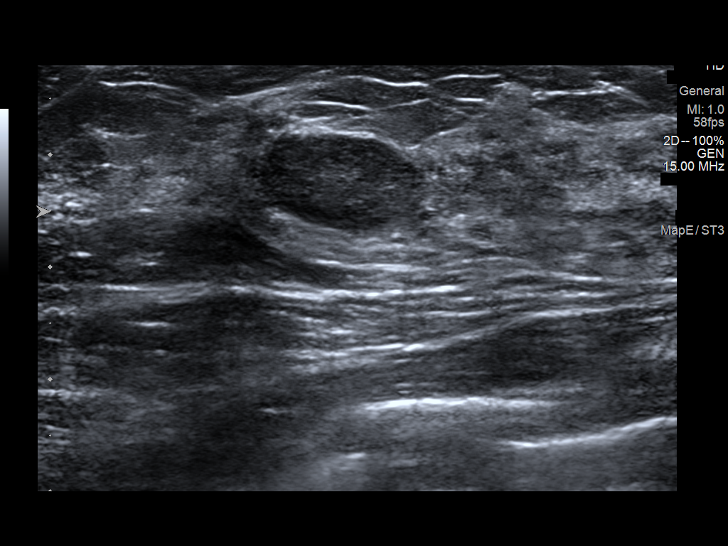
[im 2/18]
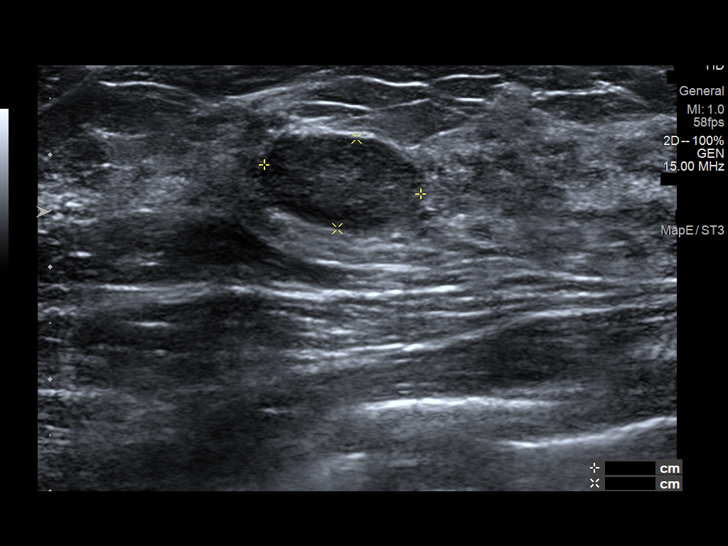
[im 4/18]
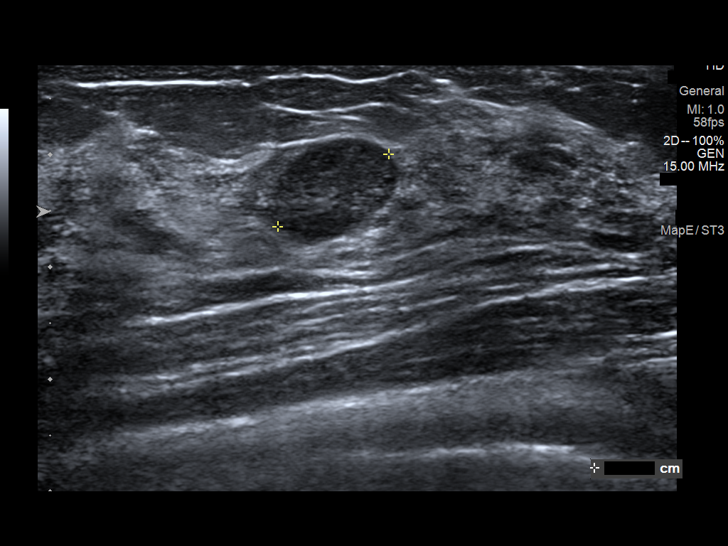
[im 5/18]
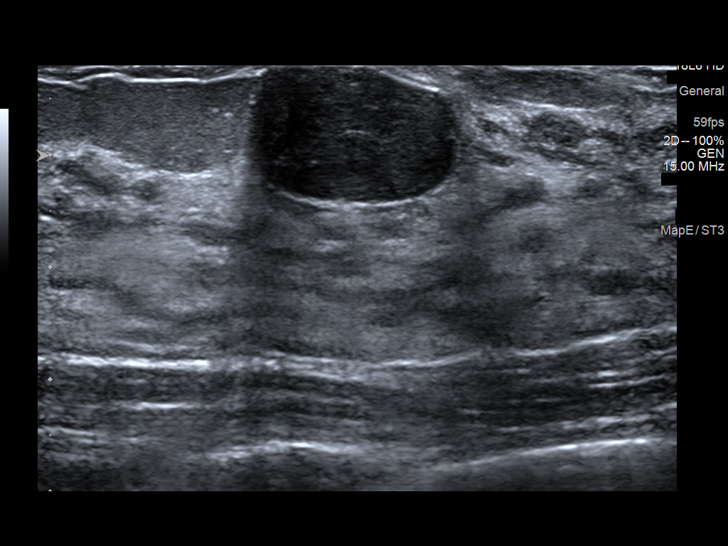
[im 6/18]
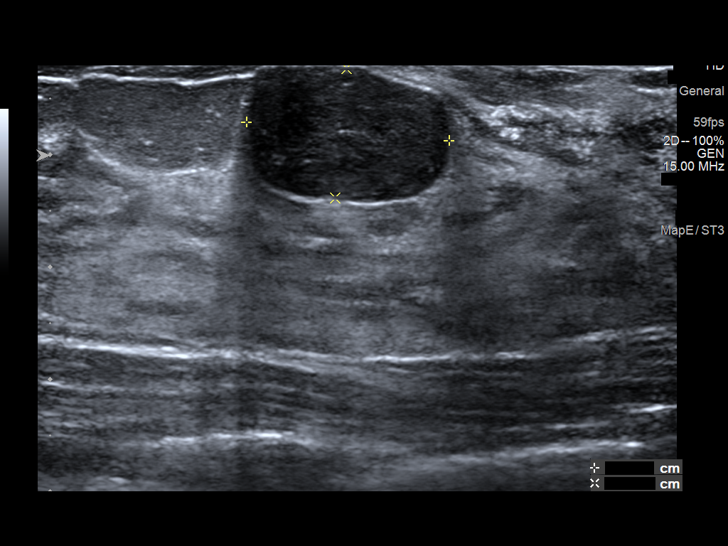
[im 8/18]
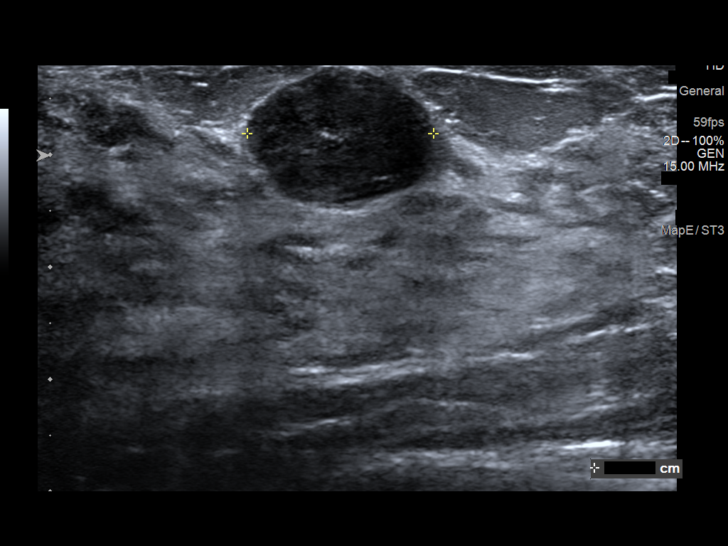
[im 9/18]
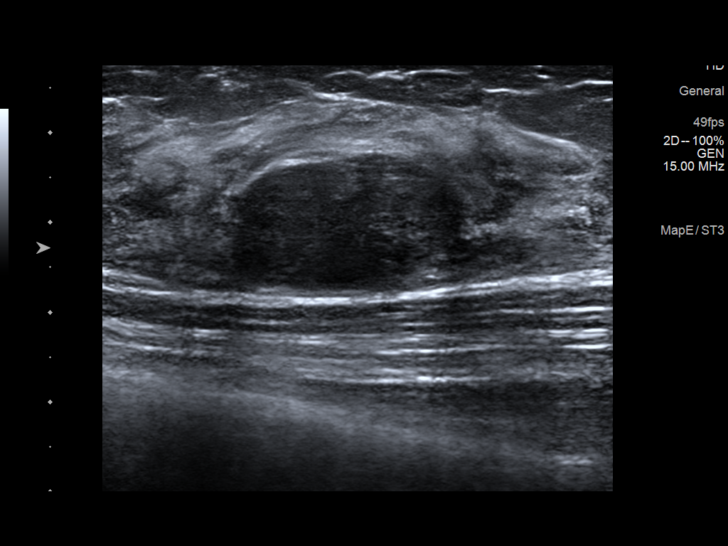
[im 10/18]
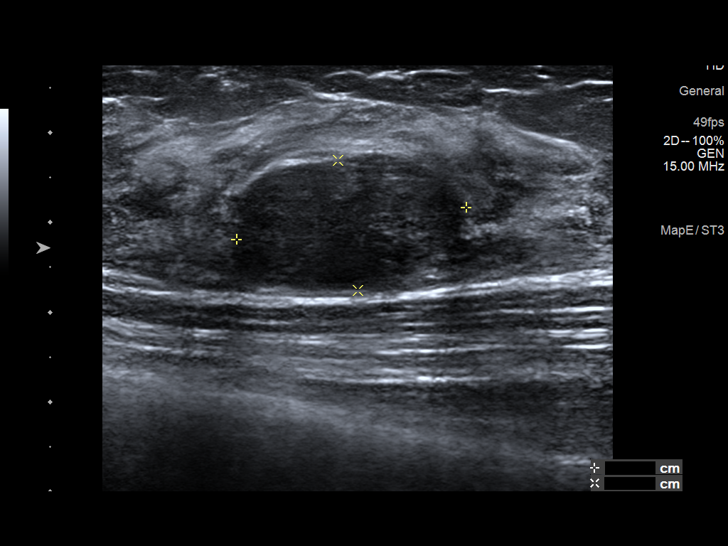
[im 11/18]
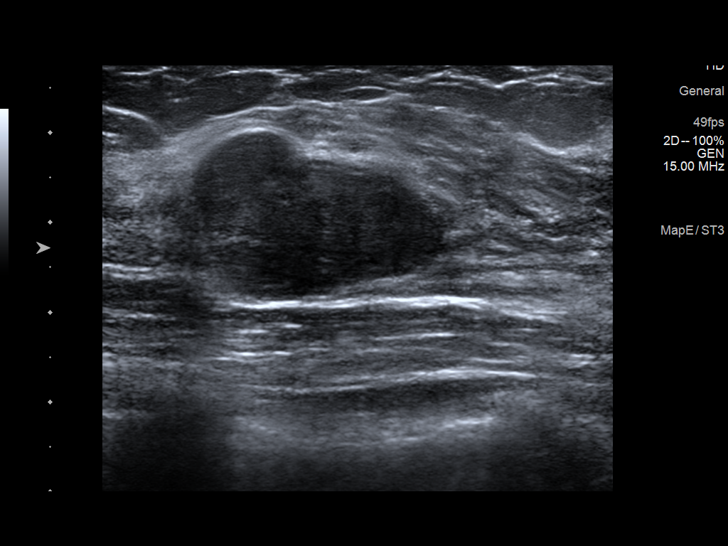
[im 13/18]
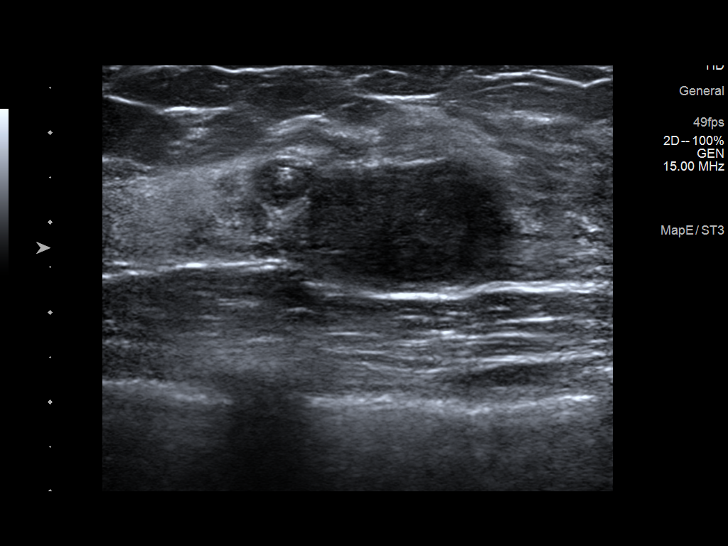
[im 14/18]
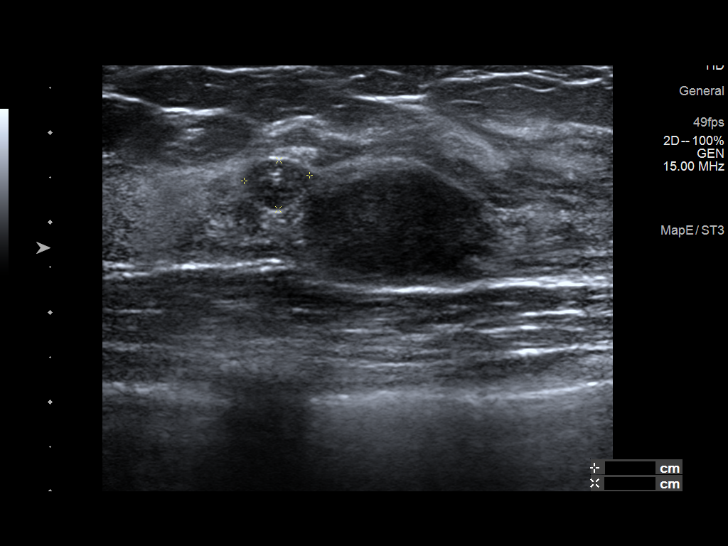
[im 15/18]
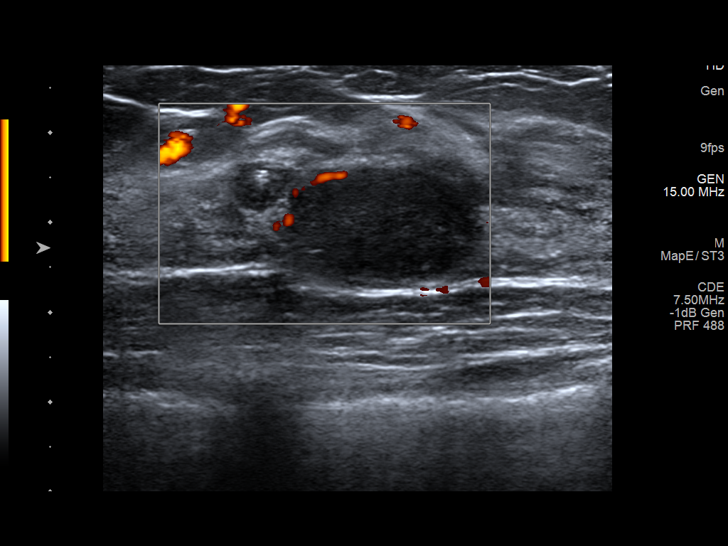
[im 17/18]
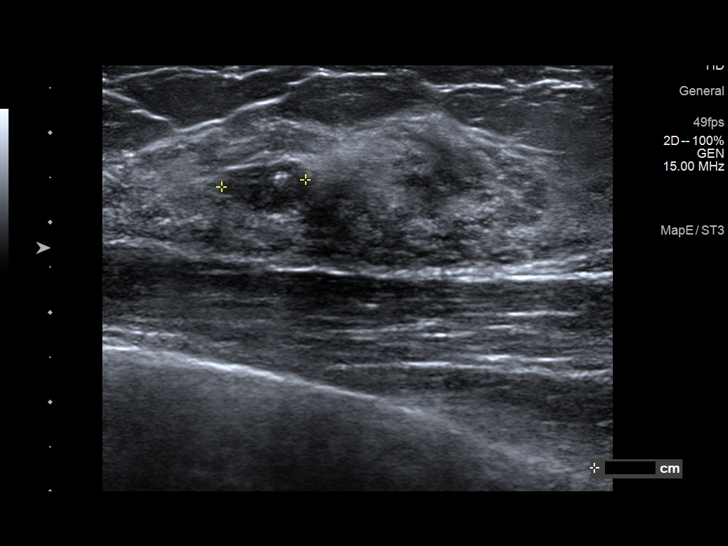
[im 18/18]
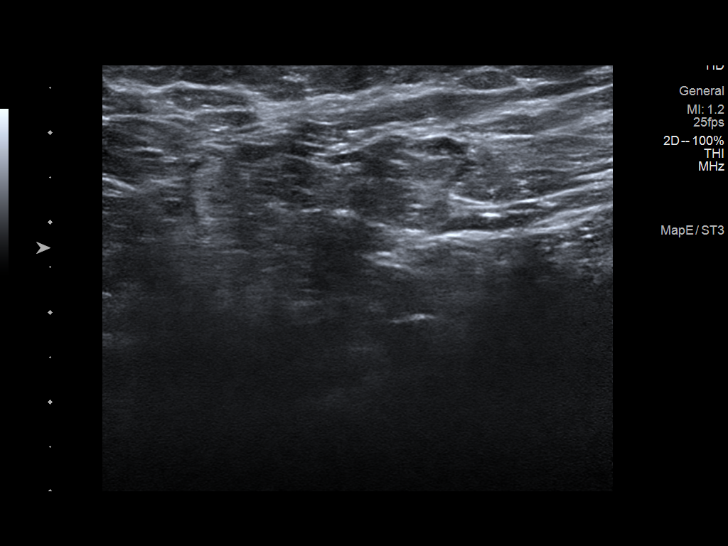

[14 of 18 positions shown; findings below may reference images not displayed]

FINDINGS: Targeted ultrasound is performed, again showing the multiple
circumscribed oval parallel hypoechoic masses in the LEFT breast as
follows:

2 o'clock 2 cm from nipple: 1.4 x 0.8 x 1.2 cm (previously 1.4 x
x 1.2 cm).

3 o'clock 1 cm from nipple: 1.8 x 1.2 x 1.7 cm (previously 1.9 x
x 1.7 cm).

10 o'clock 6 cm from nipple: 2.8 x 1.5 x 2.6 cm (previously 2.8 x
1.6 x 2.6 cm).

Adjacent mass at 10 o'clock 6 cm from nipple: 0.9 x 0.6 x 0.7 cm,
containing calcifications. This mass was not measured previously.
IMPRESSION: Stable likely benign masses in the LEFT breast, likely
fibroadenomas.

RECOMMENDATION:
LEFT breast ultrasound in 6 months.

I have discussed the findings and recommendations with the patient.
If applicable, a reminder letter will be sent to the patient
regarding the next appointment.

BI-RADS CATEGORY  3: Probably benign.

## 2021-12-29 DIAGNOSIS — M545 Low back pain, unspecified: Secondary | ICD-10-CM | POA: Diagnosis not present

## 2022-05-06 DIAGNOSIS — Z01419 Encounter for gynecological examination (general) (routine) without abnormal findings: Secondary | ICD-10-CM | POA: Diagnosis not present

## 2022-05-06 DIAGNOSIS — Z6830 Body mass index (BMI) 30.0-30.9, adult: Secondary | ICD-10-CM | POA: Diagnosis not present

## 2022-05-06 DIAGNOSIS — Z124 Encounter for screening for malignant neoplasm of cervix: Secondary | ICD-10-CM | POA: Diagnosis not present

## 2022-05-11 ENCOUNTER — Other Ambulatory Visit: Payer: Self-pay | Admitting: Obstetrics and Gynecology

## 2022-05-11 DIAGNOSIS — E669 Obesity, unspecified: Secondary | ICD-10-CM | POA: Diagnosis not present

## 2022-05-11 DIAGNOSIS — R42 Dizziness and giddiness: Secondary | ICD-10-CM | POA: Diagnosis not present

## 2022-05-11 DIAGNOSIS — N63 Unspecified lump in unspecified breast: Secondary | ICD-10-CM

## 2022-05-11 DIAGNOSIS — J45909 Unspecified asthma, uncomplicated: Secondary | ICD-10-CM | POA: Diagnosis not present

## 2022-05-11 DIAGNOSIS — Z0000 Encounter for general adult medical examination without abnormal findings: Secondary | ICD-10-CM | POA: Diagnosis not present

## 2022-06-01 DIAGNOSIS — L0501 Pilonidal cyst with abscess: Secondary | ICD-10-CM | POA: Diagnosis not present

## 2022-06-01 DIAGNOSIS — L0591 Pilonidal cyst without abscess: Secondary | ICD-10-CM | POA: Diagnosis not present

## 2022-06-26 ENCOUNTER — Other Ambulatory Visit: Payer: BC Managed Care – PPO

## 2022-07-31 ENCOUNTER — Ambulatory Visit
Admission: RE | Admit: 2022-07-31 | Discharge: 2022-07-31 | Disposition: A | Discharge status: Home / Self Care | Payer: BC Managed Care – PPO | Source: Ambulatory Visit | Attending: Obstetrics and Gynecology | Admitting: Obstetrics and Gynecology

## 2022-07-31 ENCOUNTER — Other Ambulatory Visit: Payer: Self-pay | Admitting: Obstetrics and Gynecology

## 2022-07-31 DIAGNOSIS — N63 Unspecified lump in unspecified breast: Secondary | ICD-10-CM

## 2022-07-31 DIAGNOSIS — R922 Inconclusive mammogram: Secondary | ICD-10-CM | POA: Diagnosis not present

## 2022-07-31 DIAGNOSIS — D241 Benign neoplasm of right breast: Secondary | ICD-10-CM | POA: Diagnosis not present

## 2022-07-31 DIAGNOSIS — N6310 Unspecified lump in the right breast, unspecified quadrant: Secondary | ICD-10-CM

## 2023-05-12 DIAGNOSIS — E669 Obesity, unspecified: Secondary | ICD-10-CM | POA: Diagnosis not present

## 2023-05-12 DIAGNOSIS — R5383 Other fatigue: Secondary | ICD-10-CM | POA: Diagnosis not present

## 2023-05-12 DIAGNOSIS — Z130 Encounter for screening for diseases of the blood and blood-forming organs and certain disorders involving the immune mechanism: Secondary | ICD-10-CM | POA: Diagnosis not present

## 2023-05-13 DIAGNOSIS — Z0000 Encounter for general adult medical examination without abnormal findings: Secondary | ICD-10-CM | POA: Diagnosis not present
